# Patient Record
Sex: Male | Born: 2000 | Race: White | Hispanic: No | Marital: Single | Smoking: Never smoker
Health system: Southern US, Community
[De-identification: ages and names within clinical notes are randomized; demographics above are authoritative.]

---
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
## 2022-04-21 ENCOUNTER — Ambulatory Visit
Admission: EM | Admit: 2022-04-21 | Discharge: 2022-04-21 | Disposition: A | Discharge status: Home / Self Care | Payer: Medicaid Other | Attending: Urgent Care | Admitting: Urgent Care

## 2022-04-21 DIAGNOSIS — R6889 Other general symptoms and signs: Secondary | ICD-10-CM | POA: Diagnosis not present

## 2022-04-21 DIAGNOSIS — Z1152 Encounter for screening for COVID-19: Secondary | ICD-10-CM | POA: Insufficient documentation

## 2022-04-21 LAB — RESP PANEL BY RT-PCR (FLU A&B, COVID) ARPGX2
Influenza A by PCR: NEGATIVE
Influenza B by PCR: NEGATIVE
SARS Coronavirus 2 by RT PCR: NEGATIVE

## 2022-04-21 NOTE — ED Triage Notes (Signed)
Pt. Presents to UC w/ c/o a cough, sore throat and headache for the past 4 days.

## 2022-04-21 NOTE — ED Provider Notes (Signed)
Steven Castillo    CSN: [id number] Arrival date & time: 04/21/22  1401      History   Chief Complaint Chief Complaint  Patient presents with   Cough   Sore Throat    HPI Steven Castillo is a 21 y.o. male.   HPI  Presents to urgent care with cough, sore throat, headache x 4 days.  He states he had some bodyaches and chills a few days ago but now resolved.  His most concerning symptom is the sore throat which she thinks is getting better today.  History reviewed. No pertinent past medical history.  There are no problems to display for this patient.   History reviewed. No pertinent surgical history.     Home Medications    Prior to Admission medications   Not on File    Family History History reviewed. No pertinent family history.  Social History Social History   Tobacco Use   Smoking status: Unknown     Allergies   Patient has no known allergies.   Review of Systems Review of Systems   Physical Exam Triage Vital Signs ED Triage Vitals  Enc Vitals Group     BP      Pulse      Resp      Temp      Temp src      SpO2      Weight      Height      Head Circumference      Peak Flow      Pain Score      Pain Loc      Pain Edu?      Excl. in [place]?    No data found.  Updated Vital Signs BP 118/80   Pulse 99   Temp 98.9 F (37.2 C)   Resp 17   SpO2 97%   Visual Acuity Right Eye Distance:   Left Eye Distance:   Bilateral Distance:    Right Eye Near:   Left Eye Near:    Bilateral Near:     Physical Exam Vitals reviewed.  Constitutional:      Appearance: Normal appearance.  HENT:     Mouth/Throat:     Mouth: Mucous membranes are moist.     Pharynx: Posterior oropharyngeal erythema present. No oropharyngeal exudate.  Cardiovascular:     Rate and Rhythm: Normal rate and regular rhythm.     Pulses: Normal pulses.     Heart sounds: Normal heart sounds.  Pulmonary:     Effort: Pulmonary effort is normal.     Breath sounds:  Normal breath sounds.  Skin:    General: Skin is warm and dry.  Neurological:     General: No focal deficit present.     Mental Status: He is alert and oriented to person, place, and time.  Psychiatric:        Mood and Affect: Mood normal.        Behavior: Behavior normal.      UC Treatments / Results  Labs (all labs ordered are listed, but only abnormal results are displayed) Labs Reviewed - No data to display  EKG   Radiology No results found.  Procedures Procedures (including critical care time)  Medications Ordered in UC Medications - No data to display  Initial Impression / Assessment and Plan / UC Course  I have reviewed the triage vital signs and the nursing notes.  Pertinent labs & imaging results that were available  during my care of the patient were reviewed by me and considered in my medical decision making (see chart for details).   Patient is afebrile here without recent antipyretics. Satting well on room air. Overall is well appearing, well hydrated, without respiratory distress. Pulmonary exam is unremarkable.  Lungs CTAB without wheezes, rhonchi, rales.  Pharynx is mildly erythematous without peritonsillar exudates.  Likely viral process including COVID and influenza.  Respiratory swab is pending however patient is outside the window for antiviral medication.  Continue to recommend use of OTC medication for symptom control including ibuprofen for relief of sore throat along with warm salt water gargles.  Final Clinical Impressions(s) / UC Diagnoses   Final diagnoses:  None   Discharge Instructions   None    ED Prescriptions   None    PDMP not reviewed this encounter.   Charma Igo, [place] 04/21/22 1446

## 2022-04-21 NOTE — Discharge Instructions (Signed)
You have been diagnosed with a viral upper respiratory infection based on your symptoms and exam. Viral illnesses cannot be treated with antibiotics - they are self limiting - and you should find your symptoms resolving within a few days. Get plenty of rest and non-caffeinated fluids.  We have performed a respiratory swab checking for COVID, and influenza.  Because your symptoms are outside the window for antiviral treatment, no medication was prescribed for your illness today.  We recommend you use over-the-counter medications for symptom control including Tylenol or ibuprofen for fever, chills or body aches, and cold/cough medication.  Saline mist spray is helpful for removing excess mucus from your nose.  Room humidifiers are helpful to ease breathing at night. You might also find relief of nasal/sinus congestion symptoms by using a nasal decongestant such as Flonase (fluticasone) or Sudafed sinus (pseudoephedrine).  You will need to obtain Sudafed from behind the pharmacist counter.  Speak to the pharmacist to verify that you are not duplicating medications with other over-the-counter formulations that you may be using.   Follow up here or with your primary care provider if your symptoms are worsening or not improving.

## 2022-07-28 ENCOUNTER — Ambulatory Visit
Admission: EM | Admit: 2022-07-28 | Discharge: 2022-07-28 | Disposition: A | Discharge status: Home / Self Care | Payer: Medicaid Other

## 2022-07-28 DIAGNOSIS — R6889 Other general symptoms and signs: Secondary | ICD-10-CM | POA: Diagnosis not present

## 2022-07-28 NOTE — ED Triage Notes (Signed)
Patient presents to UC for cough, chills, weakness, nasal congestion, body aches, and burning sensation with cough since Saturday. Taking ibuprofen. Took one dose of Nyquil.  Denies fever.

## 2022-07-28 NOTE — ED Provider Notes (Signed)
[facility]    CSN: [id number] Arrival date & time: 07/28/22  1728      History   Chief Complaint Chief Complaint  Patient presents with   Cough    Chills, body aches, threw up when I took DayQuil, chest burns when I cough - Entered by patient   Chills   Generalized Body Aches   Weakness   Nasal Congestion    HPI Steven Castillo is a 22 y.o. male.    Cough Weakness Associated symptoms: cough     Presents to urgent care with complaint of cough, chills, weakness, nasal congestion, body aches, "burning sensation" with cough x 3 days.  Denies fever.  1 episode of vomiting after taking a dose of liquid DayQuil.  He is using ibuprofen for his symptoms.  Taking 600 mg once a day.  History reviewed. No pertinent past medical history.  There are no problems to display for this patient.   History reviewed. No pertinent surgical history.     Home Medications    Prior to Admission medications   Not on File    Family History History reviewed. No pertinent family history.  Social History Social History   Tobacco Use   Smoking status: Unknown     Allergies   Patient has no known allergies.   Review of Systems Review of Systems  Respiratory:  Positive for cough.   Neurological:  Positive for weakness.     Physical Exam Triage Vital Signs ED Triage Vitals [07/28/22 1926]  Enc Vitals Group     BP      Pulse      Resp      Temp      Temp src      SpO2      Weight      Height      Head Circumference      Peak Flow      Pain Score 4     Pain Loc      Pain Edu?      Excl. in [place]?    No data found.  Updated Vital Signs There were no vitals taken for this visit.  Visual Acuity Right Eye Distance:   Left Eye Distance:   Bilateral Distance:    Right Eye Near:   Left Eye Near:    Bilateral Near:     Physical Exam Vitals reviewed.  Constitutional:      Appearance: Normal appearance. He is ill-appearing.  HENT:      Mouth/Throat:     Mouth: Mucous membranes are moist.     Pharynx: No oropharyngeal exudate or posterior oropharyngeal erythema.  Cardiovascular:     Rate and Rhythm: Normal rate and regular rhythm.     Pulses: Normal pulses.     Heart sounds: Normal heart sounds.  Pulmonary:     Effort: Pulmonary effort is normal.     Breath sounds: Normal breath sounds.  Skin:    General: Skin is warm and dry.  Neurological:     General: No focal deficit present.     Mental Status: He is alert and oriented to person, place, and time.      UC Treatments / Results  Labs (all labs ordered are listed, but only abnormal results are displayed) Labs Reviewed - No data to display  EKG   Radiology No results found.  Procedures Procedures (including critical care time)  Medications Ordered in UC Medications - No data to display  Initial  Impression / Assessment and Plan / UC Course  I have reviewed the triage vital signs and the nursing notes.  Pertinent labs & imaging results that were available during my care of the patient were reviewed by me and considered in my medical decision making (see chart for details).   Patient is afebrile here without recent antipyretics. Satting well on room air. Overall is ill appearing, well hydrated, without respiratory distress. Pulmonary exam is unremarkable.  Lungs CTAB without wheezing, rhonchi, rales.  Pharyngeal erythema or peritonsillar exudates.  Patient's symptoms are consistent with an acute viral process including influenza or COVID.  He is outside the treatment window for influenza and no antiviral therapy was offered.  Offered COVID swab but patient declined because of the long result delay.  Recommended continued use of OTC medication for symptom control including more frequent doses of antipyretic/analgesic medication.  Patient acknowledges understanding and agreement with this treatment plan.  Final Clinical Impressions(s) / UC Diagnoses   Final  diagnoses:  None   Discharge Instructions   None    ED Prescriptions   None    PDMP not reviewed this encounter.   Rose Phi, [place] 07/28/22 1936

## 2022-07-28 NOTE — Discharge Instructions (Addendum)
Follow up here or with your primary care provider if your symptoms are worsening or not improving.
